# Patient Record
Sex: Male | Born: 1950 | Race: Black or African American | Hispanic: No | Marital: Married | Smoking: Never smoker
Health system: Southern US, Community
[De-identification: ages and names within clinical notes are randomized; demographics above are authoritative.]

## PROBLEM LIST (undated history)

## (undated) DIAGNOSIS — I10 Essential (primary) hypertension: Secondary | ICD-10-CM

## (undated) DIAGNOSIS — E119 Type 2 diabetes mellitus without complications: Secondary | ICD-10-CM

---

## 2013-09-01 ENCOUNTER — Encounter (HOSPITAL_COMMUNITY): Payer: Self-pay | Admitting: Emergency Medicine

## 2013-09-01 DIAGNOSIS — E119 Type 2 diabetes mellitus without complications: Secondary | ICD-10-CM | POA: Insufficient documentation

## 2013-09-01 DIAGNOSIS — I10 Essential (primary) hypertension: Secondary | ICD-10-CM | POA: Insufficient documentation

## 2013-09-01 DIAGNOSIS — Z79899 Other long term (current) drug therapy: Secondary | ICD-10-CM | POA: Diagnosis not present

## 2013-09-01 DIAGNOSIS — Z880 Allergy status to penicillin: Secondary | ICD-10-CM | POA: Insufficient documentation

## 2013-09-01 DIAGNOSIS — Z91199 Patient's noncompliance with other medical treatment and regimen due to unspecified reason: Secondary | ICD-10-CM | POA: Insufficient documentation

## 2013-09-01 DIAGNOSIS — F209 Schizophrenia, unspecified: Secondary | ICD-10-CM | POA: Insufficient documentation

## 2013-09-01 DIAGNOSIS — E669 Obesity, unspecified: Secondary | ICD-10-CM | POA: Diagnosis not present

## 2013-09-01 DIAGNOSIS — Z9119 Patient's noncompliance with other medical treatment and regimen: Secondary | ICD-10-CM | POA: Diagnosis not present

## 2013-09-01 LAB — CBC WITH DIFFERENTIAL/PLATELET
Basophils Absolute: 0 10*3/uL (ref 0.0–0.1)
Basophils Relative: 0 % (ref 0–1)
Eosinophils Absolute: 0.2 10*3/uL (ref 0.0–0.7)
Eosinophils Relative: 2 % (ref 0–5)
HCT: 37.1 % — ABNORMAL LOW (ref 39.0–52.0)
HEMOGLOBIN: 13.1 g/dL (ref 13.0–17.0)
LYMPHS ABS: 3.2 10*3/uL (ref 0.7–4.0)
Lymphocytes Relative: 38 % (ref 12–46)
MCH: 30.1 pg (ref 26.0–34.0)
MCHC: 35.3 g/dL (ref 30.0–36.0)
MCV: 85.3 fL (ref 78.0–100.0)
MONOS PCT: 6 % (ref 3–12)
Monocytes Absolute: 0.5 10*3/uL (ref 0.1–1.0)
NEUTROS ABS: 4.6 10*3/uL (ref 1.7–7.7)
Neutrophils Relative %: 54 % (ref 43–77)
Platelets: 234 10*3/uL (ref 150–400)
RBC: 4.35 MIL/uL (ref 4.22–5.81)
RDW: 13.8 % (ref 11.5–15.5)
WBC: 8.5 10*3/uL (ref 4.0–10.5)

## 2013-09-01 LAB — COMPREHENSIVE METABOLIC PANEL
ALT: <5 U/L (ref 0–53)
Albumin: 3.7 g/dL (ref 3.5–5.2)
Alkaline Phosphatase: 115 U/L (ref 39–117)
BUN: 17 mg/dL (ref 6–23)
CHLORIDE: 96 meq/L (ref 96–112)
CO2: 22 meq/L (ref 19–32)
CREATININE: 1.78 mg/dL — AB (ref 0.50–1.35)
Calcium: 9.5 mg/dL (ref 8.4–10.5)
GFR calc Af Amer: 46 mL/min — ABNORMAL LOW (ref >90)
GFR, EST NON AFRICAN AMERICAN: 39 mL/min — AB (ref >90)
Glucose, Bld: 440 mg/dL — ABNORMAL HIGH (ref 70–99)
Potassium: 4.3 mEq/L (ref 3.7–5.3)
Sodium: 132 mEq/L — ABNORMAL LOW (ref 137–147)
Total Protein: 7.4 g/dL (ref 6.0–8.3)

## 2013-09-01 LAB — URINALYSIS, ROUTINE W REFLEX MICROSCOPIC
Bilirubin Urine: NEGATIVE
Glucose, UA: >1000 mg/dL — AB
Hgb urine dipstick: NEGATIVE
Ketones, ur: NEGATIVE mg/dL
LEUKOCYTES UA: NEGATIVE
Nitrite: NEGATIVE
PH: 6 (ref 5.0–8.0)
Protein, ur: NEGATIVE mg/dL
Specific Gravity, Urine: 1.011 (ref 1.005–1.030)
Urobilinogen, UA: 0.2 mg/dL (ref 0.0–1.0)

## 2013-09-01 LAB — URINE MICROSCOPIC-ADD ON

## 2013-09-01 LAB — CBG MONITORING, ED: Glucose-Capillary: 414 mg/dL — ABNORMAL HIGH (ref 70–99)

## 2013-09-01 NOTE — ED Notes (Signed)
The pt is here  For  A high blood sugar.  His sugar is not usually controlled.  He is on oral agents.   He has no other complaints

## 2013-09-02 ENCOUNTER — Emergency Department (HOSPITAL_COMMUNITY)
Admission: EM | Admit: 2013-09-02 | Discharge: 2013-09-02 | Disposition: A | Discharge status: Home / Self Care | Payer: BC Managed Care – PPO | Attending: Emergency Medicine | Admitting: Emergency Medicine

## 2013-09-02 ENCOUNTER — Emergency Department (HOSPITAL_COMMUNITY): Payer: BC Managed Care – PPO

## 2013-09-02 DIAGNOSIS — Z9114 Patient's other noncompliance with medication regimen: Secondary | ICD-10-CM

## 2013-09-02 DIAGNOSIS — E119 Type 2 diabetes mellitus without complications: Secondary | ICD-10-CM | POA: Diagnosis not present

## 2013-09-02 DIAGNOSIS — R739 Hyperglycemia, unspecified: Secondary | ICD-10-CM

## 2013-09-02 HISTORY — DX: Type 2 diabetes mellitus without complications: E11.9

## 2013-09-02 HISTORY — DX: Essential (primary) hypertension: I10

## 2013-09-02 LAB — CBG MONITORING, ED
Glucose-Capillary: 298 mg/dL — ABNORMAL HIGH (ref 70–99)
Glucose-Capillary: 333 mg/dL — ABNORMAL HIGH (ref 70–99)
Glucose-Capillary: 254 mg/dL — ABNORMAL HIGH (ref 70–99)

## 2013-09-02 LAB — LITHIUM LEVEL: Lithium Lvl: <0.25 mEq/L — ABNORMAL LOW (ref 0.80–1.40)

## 2013-09-02 MED ORDER — GLIPIZIDE ER 10 MG PO TB24
10.0000 mg | ORAL_TABLET | ORAL | Status: AC
  Administered 2013-09-02: 10 mg via ORAL
  Filled 2013-09-02: qty 1

## 2013-09-02 MED ORDER — SODIUM CHLORIDE 0.9 % IV BOLUS (SEPSIS)
1000.0000 mL | Freq: Once | INTRAVENOUS | Status: AC
  Administered 2013-09-02: 1000 mL via INTRAVENOUS

## 2013-09-02 MED ORDER — SODIUM CHLORIDE 0.9 % IV BOLUS (SEPSIS): 1000.0000 mL | Freq: Once | INTRAVENOUS | Status: DC

## 2013-09-02 NOTE — ED Provider Notes (Signed)
CSN: [id number]     Arrival date & time 09/01/13  2140 History   First MD Initiated Contact with Patient 09/02/13 0128     Chief Complaint  Patient presents with  . Hyperglycemia     (Consider location/radiation/quality/duration/timing/severity/associated sxs/prior Treatment) HPI Comments: [name] is a obese, 63 year old male, who is non-insulin-dependent diabetic.  Also has a history of schizophrenia is, afraid to take insulin because he is, afraid of needles, but hasn't taken his glipizide in several days.  Either as he doesn't want to be a diabetic.  He, [place]. He denies any nausea, vomiting, diarrhea, URI, symptoms, fevers, headache, myalgias  Patient is a 63 y.o. male presenting with hyperglycemia. The history is provided by the patient.  Hyperglycemia Severity:  Unable to specify Onset quality:  Gradual Timing:  Sporadic Progression:  Unchanged Chronicity:  Recurrent Diabetes status:  Controlled with oral medications Current diabetic therapy:  Glipizide Time since last antidiabetic medication:  2 days Context: noncompliance   Relieved by:  None tried Ineffective treatments:  None tried Associated symptoms: increased thirst and polyuria   Associated symptoms: no chest pain, no dysuria, no fever, no nausea, no shortness of breath and no vomiting   Risk factors: obesity     Past Medical History  Diagnosis Date  . Diabetes mellitus without complication   . Hypertension    History reviewed. No pertinent past surgical history. No family history on file. History  Substance Use Topics  . Smoking status: Never Smoker   . Smokeless tobacco: Not on file  . Alcohol Use: No    Review of Systems  Constitutional: Negative for fever.  Respiratory: Negative for cough and shortness of breath.   Cardiovascular: Negative for chest pain and leg swelling.  Gastrointestinal: Negative for nausea, vomiting and  diarrhea.  Endocrine: Positive for polydipsia and polyuria.  Genitourinary: Negative for dysuria.  Musculoskeletal: Negative for myalgias.  Skin: Negative for rash and wound.  All other systems reviewed and are negative.      Allergies  Haldol; Risperidone and related; Thorazine; and Penicillins  Home Medications   Current Outpatient Rx  Name  Route  Sig  Dispense  Refill  . amLODipine-olmesartan (AZOR) 10-40 MG per tablet   Oral   Take 1 tablet by mouth daily.         . diphenhydrAMINE (BENADRYL) 50 MG tablet   Oral   Take 50 mg by mouth 2 (two) times daily.         . fluPHENAZine (PROLIXIN) 5 MG tablet   Oral   Take 5 mg by mouth 2 (two) times daily.         Marland Kitchen. glipiZIDE (GLUCOTROL XL) 10 MG 24 hr tablet   Oral   Take 10 mg by mouth daily with breakfast.         . lithium carbonate 300 MG capsule   Oral   Take 600 mg by mouth 2 (two) times daily with a meal.         . Multiple Vitamins-Minerals (MULTIVITAMIN WITH MINERALS) tablet   Oral   Take 1 tablet by mouth daily.         . QUEtiapine (SEROQUEL) 200 MG tablet   Oral   Take 200 mg by mouth 2 (two) times daily. 1 tablet at 1 pm and 1 tablet at bedtime          BP 181/123  Pulse 83  Temp(Src) 98.2  F (36.8 C) (Oral)  Resp 24  Ht 5\' 8"  (1.727 m)  Wt 255 lb (115.667 kg)  BMI 38.78 kg/m2  SpO2 100% Physical Exam  Vitals reviewed. Constitutional: He is oriented to person, place, and time. He appears well-developed and well-nourished. No distress.  HENT:  Head: Normocephalic and atraumatic.  Right Ear: External ear normal.  Left Ear: External ear normal.  Mouth/Throat: Oropharynx is clear and moist.  Eyes: Pupils are equal, round, and reactive to light.  Neck: Normal range of motion.  Cardiovascular: Normal rate and regular rhythm.   Pulmonary/Chest: Effort normal.  Abdominal: Soft. He exhibits no distension.  Musculoskeletal: Normal range of motion. He exhibits no edema and no  tenderness.  Neurological: He is alert and oriented to person, place, and time.  Skin: Skin is warm and dry.    ED Course  Procedures (including critical care time) Labs Review Labs Reviewed  URINALYSIS, ROUTINE W REFLEX MICROSCOPIC - Abnormal; Notable for the following:    Glucose, UA >1000 (*)    All other components within normal limits  CBC WITH DIFFERENTIAL - Abnormal; Notable for the following:    HCT 37.1 (*)    All other components within normal limits  COMPREHENSIVE METABOLIC PANEL - Abnormal; Notable for the following:    Sodium 132 (*)    Glucose, Bld 440 (*)    Creatinine, Ser 1.78 (*)    Total Bilirubin <0.2 (*)    GFR calc non Af Amer 39 (*)    GFR calc Af Amer 46 (*)    All other components within normal limits  LITHIUM LEVEL - Abnormal; Notable for the following:    Lithium Lvl <0.25 (*)    All other components within normal limits  CBG MONITORING, ED - Abnormal; Notable for the following:    Glucose-Capillary 414 (*)    All other components within normal limits  CBG MONITORING, ED - Abnormal; Notable for the following:    Glucose-Capillary 333 (*)    All other components within normal limits  CBG MONITORING, ED - Abnormal; Notable for the following:    Glucose-Capillary 298 (*)    All other components within normal limits  CBG MONITORING, ED - Abnormal; Notable for the following:    Glucose-Capillary 254 (*)    All other components within normal limits  URINE MICROSCOPIC-ADD ON   Imaging Review Dg Chest Portable 1 View  09/02/2013   CLINICAL DATA:  Hyperglycemia and  EXAM: PORTABLE CHEST - 1 VIEW  COMPARISON:  None available  FINDINGS: Moderate cardiomegaly is present. Tortuosity of the intrathoracic aorta is noted.  The lungs are normally inflated. No airspace consolidation, pleural effusion, or pulmonary edema is identified. There is no pneumothorax.  No acute osseous abnormality identified.  IMPRESSION: Moderate cardiomegaly. No acute cardiopulmonary  abnormality identified.   Electronically Signed   By: Rise Mu M.D.   On: 09/02/2013 01:17     EKG Interpretation None      MDM  Patient was given 2 L of IV normal saline, as well as his normal 10 mg 24-hour Glucotrol tablet his blood sugar at the time of his discharge was under 250 is been encouraged to take his medication on a regular basis, and to followup with his primary care physician when he arrives home Final diagnoses:  Hyperglycemia  Noncompliance with medication regimen         Arman Filter, NP 09/02/13 [phone]

## 2013-09-02 NOTE — ED Notes (Signed)
Updated family on plan of care. Patient communicated understanding.

## 2013-09-02 NOTE — ED Notes (Signed)
Patient has ride home with cousin

## 2013-09-02 NOTE — Discharge Instructions (Signed)
I cannot stress enough how important it is for you to take your medication on a regular basis.  Please make a point with your primary care physician when, you arrive home for followup.  Please take your medication on a daily basis.  Watch your diet.  Monitor your blood sugars

## 2013-09-03 NOTE — ED Provider Notes (Signed)
Medical screening examination/treatment/procedure(s) were performed by non-physician practitioner and as supervising physician I was immediately available for consultation/collaboration.   Julia Kulzer, MD 09/03/13 0756 

## 2015-10-24 IMAGING — CR DG CHEST 1V PORT
2 series · 2 of 2 positions shown · non-contrast
Comparison: None available

CLINICAL DATA: Hyperglycemia and

EXAM:
PORTABLE CHEST - 1 VIEW

[AP (1 of 2)]
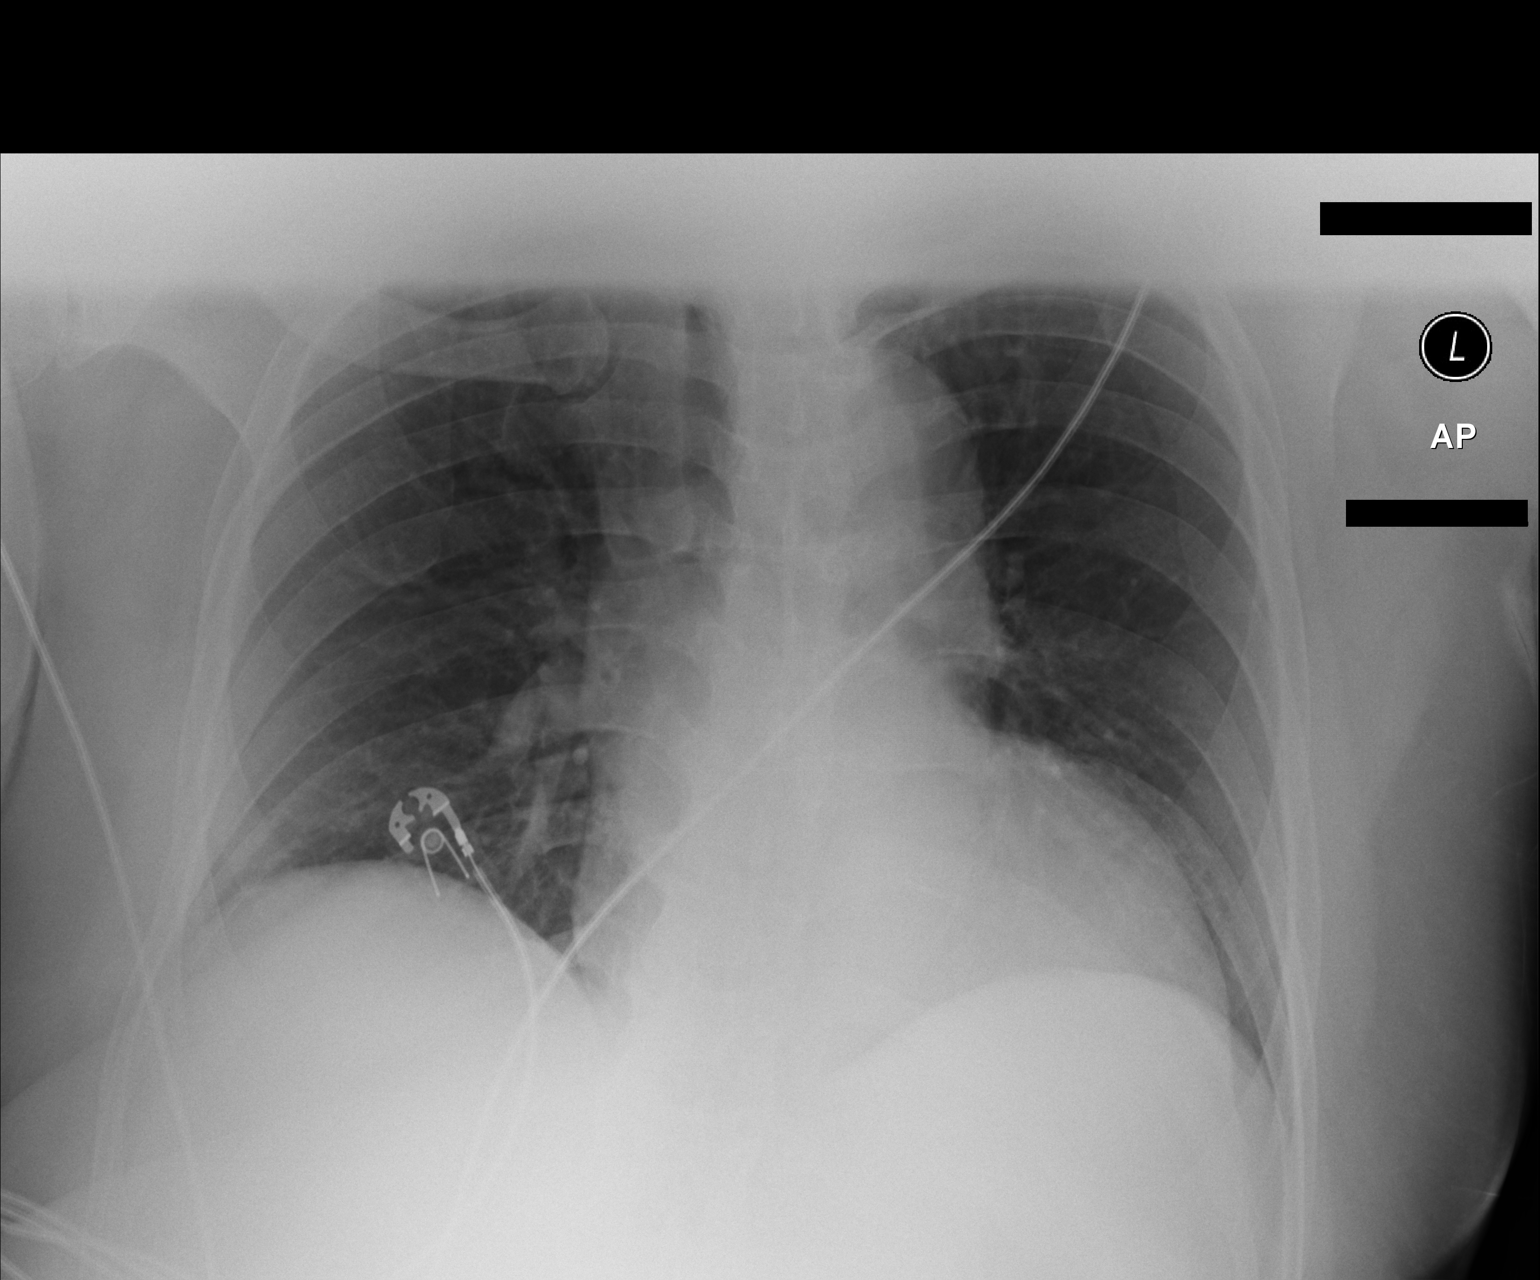

[AP (2 of 2)]
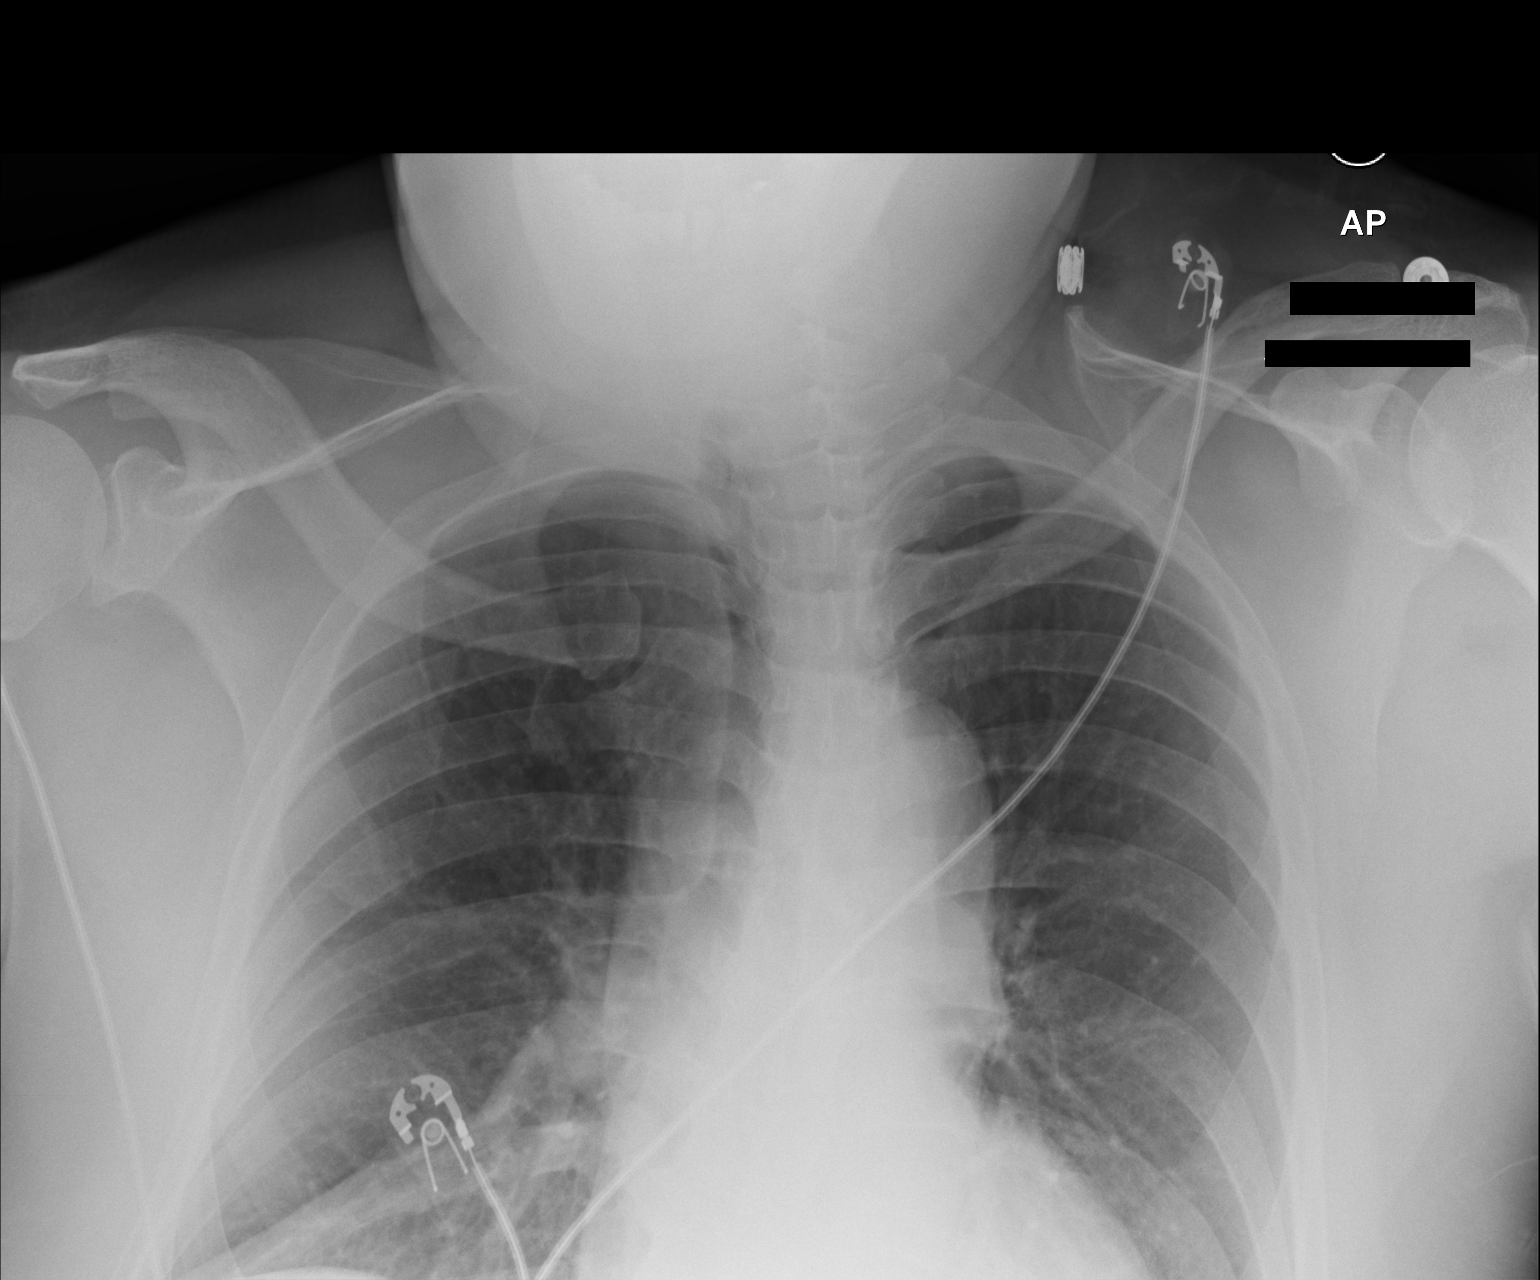

[2 of 2 positions shown; findings below may reference images not displayed]

FINDINGS: Moderate cardiomegaly is present. Tortuosity of the intrathoracic
aorta is noted.

The lungs are normally inflated. No airspace consolidation, pleural
effusion, or pulmonary edema is identified. There is no
pneumothorax.

No acute osseous abnormality identified.
IMPRESSION: Moderate cardiomegaly. No acute cardiopulmonary abnormality
identified.
